# Patient Record
Sex: Male | Born: 1995 | Race: Black or African American | Hispanic: No | Marital: Single
Health system: Southern US, Community
[De-identification: ages and names within clinical notes are randomized; demographics above are authoritative.]

---
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
## 2017-08-06 ENCOUNTER — Telehealth: Payer: Self-pay | Admitting: Podiatry

## 2017-08-06 NOTE — Telephone Encounter (Signed)
Called and left message on home answering machine letting pt's mother know that since the pt is over 20 years old he would have to fill out and sign a medical records release form to obtain his records. I stated we can e-mail him one, fax it, send it via postal mail, or he could come by the office to fill it out. I asked her to call me back at [phone] to let me know how he would like to obtain the form.

## 2017-08-06 NOTE — Telephone Encounter (Signed)
I'm calling for [facility] who was seen around a year ago for an infected ingrown toenail. 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 So can someone please give me a call back here at [phone] so I can address how can he go about getting the record that he needs to have to use during the appeal at [facility] [employer] [facility].

## 2017-08-07 ENCOUNTER — Encounter: Payer: Self-pay | Admitting: Podiatry

## 2017-08-07 ENCOUNTER — Telehealth: Payer: Self-pay | Admitting: Podiatry

## 2017-08-07 NOTE — Progress Notes (Signed)
A medical records release form was e-mailed to pt at the given e-mail address of [email or web address] at 1:31 pm. Instructions were given in the e-mail to physically sign the form and how he can return it to me so I can release his records to him.

## 2017-08-07 NOTE — Telephone Encounter (Signed)
This is Kassim's mother calling back. Can you please e-mail the medical records release form to him at [email or web address]? Thank you.

## 2017-09-25 ENCOUNTER — Ambulatory Visit: Payer: Medicaid Other | Admitting: Family Medicine

## 2017-11-18 ENCOUNTER — Encounter: Payer: Self-pay | Admitting: Family Medicine

## 2017-11-18 ENCOUNTER — Other Ambulatory Visit: Payer: Self-pay

## 2017-11-18 ENCOUNTER — Ambulatory Visit: Payer: Medicaid Other | Attending: Family Medicine | Admitting: Family Medicine

## 2017-11-18 VITALS — BP 115/79 | HR 69 | Temp 99.0°F | Resp 16 | Ht 71.0 in | Wt 253.4 lb

## 2017-11-18 DIAGNOSIS — Z8249 Family history of ischemic heart disease and other diseases of the circulatory system: Secondary | ICD-10-CM | POA: Diagnosis not present

## 2017-11-18 DIAGNOSIS — Z7689 Persons encountering health services in other specified circumstances: Secondary | ICD-10-CM | POA: Diagnosis present

## 2017-11-18 DIAGNOSIS — L309 Dermatitis, unspecified: Secondary | ICD-10-CM | POA: Diagnosis not present

## 2017-11-18 MED ORDER — HYDROCORTISONE 0.5 % EX CREA: 1.0000 "application " | TOPICAL_CREAM | Freq: Two times a day (BID) | CUTANEOUS | 6 refills | Status: AC

## 2017-11-18 MED ORDER — KETOCONAZOLE 2 % EX SHAM: 1.0000 "application " | MEDICATED_SHAMPOO | CUTANEOUS | 6 refills | Status: AC

## 2017-11-18 MED ORDER — KETOCONAZOLE 2 % EX CREA: 1.0000 "application " | TOPICAL_CREAM | Freq: Every day | CUTANEOUS | 6 refills | Status: AC

## 2017-11-18 NOTE — Progress Notes (Signed)
Patient stated he hoped to overall establish care today, he also wanted to see if he could get something prescribed for his eczema.

## 2017-11-18 NOTE — Patient Instructions (Signed)

## 2017-11-18 NOTE — Progress Notes (Signed)
Subjective:    Patient ID: Shane Harrell, male    DOB: 01/17/1996, 22 y.o.   MRN: [id number]  HPI  22 yo male who presents to establish care.  Patient reports no significant past medical history.  Patient has had issues with eczema.  Patient reports that he has dry skin patches on his cheeks bilaterally.  Patient also reports that he will have flakiness of the scalp that occurs 1 to 2 days after washing his hair.  Patient states that his scalp occasionally itches.  Patient has not tried any prescription medication.  Patient has used over-the-counter bio oil on his face and has used otc hair grease on his scalp.  Patient is a rising [employer].  Patient is currently on no prescription medications.  Patient is single and does not smoke.  Patient reports family history significant for mother with hypertension.  Patient has had no past surgeries but did fall and slipped on the ice at age 22 or [age] fracturing both elbows.  Patient denies any current muscle or joint pain. History reviewed. No pertinent past medical history.  History reviewed. No pertinent surgical history.  History reviewed. No pertinent family history.  No Known Allergies  Review of Systems  Constitutional: Negative for chills, fatigue, fever and unexpected weight change.  HENT: Negative for hearing loss, sore throat and trouble swallowing.   Respiratory: Negative for cough and shortness of breath.   Cardiovascular: Negative for chest pain, palpitations and leg swelling.  Gastrointestinal: Negative for abdominal pain and nausea.  Genitourinary: Negative for dysuria and frequency.  Neurological: Negative for dizziness and headaches.  Hematological: Does not bruise/bleed easily.       Objective:   Physical Exam  Constitutional: He appears well-developed and well-nourished.  Overweight young adult male in no acute distress.  Patient is wearing glasses.  HENT:  Head:  Normocephalic.  Mouth/Throat: Oropharynx is clear and moist.  Tympanic membranes are within normal bilaterally  Eyes: Pupils are equal, round, and reactive to light. Conjunctivae and EOM are normal.  Neck: Normal range of motion. Neck supple. No thyromegaly present.  Cardiovascular: Normal rate and regular rhythm.  Pulmonary/Chest: Effort normal and breath sounds normal.  Abdominal: Soft. There is no tenderness.  Musculoskeletal:  No CVA tenderness  Lymphadenopathy:    He has no cervical adenopathy.  Skin: Rash (Patient with dry silvery appearing skin patches on the bilateral cheeks) noted.  Nursing note and vitals reviewed. BP 115/79 (BP Location: Left Arm, Patient Position: Sitting, Cuff Size: Normal)   Pulse 69   Temp 99 F (37.2 C) (Oral)   Resp 16   Ht 5\' 11"  (1.803 m)   Wt 253 lb 6.4 oz (114.9 kg)   SpO2 99%   BMI 35.34 kg/m      Assessment & Plan:  1. Eczema, unspecified type Handout provided on eczema/atopic dermatitis.  Patient will initially use ketoconazole shampoo which he may use twice per week or as often as every other day to help with scalp itchiness and flaking.  Patient will initially use ketoconazole cream to the affected facial areas once daily.  If ketoconazole cream is not effective, prescription also provided for hydrocortisone cream but patient was also made aware that steroid type cream should be used sparingly on delicate skin such as the face area and that steroid creams such as hydrocortisone can cause skin discoloration/hyperpigmentation.  Patient will follow-up as needed and in 6 months - ketoconazole (NIZORAL) 2 %  shampoo; Apply 1 application topically 2 (two) times a week.  Dispense: 120 mL; Refill: 6 - ketoconazole (NIZORAL) 2 % cream; Apply 1 application topically daily.  Dispense: 15 g; Refill: 6 - hydrocortisone cream 0.5 %; Apply 1 application topically 2 (two) times daily.  Dispense: 30 g; Refill: 6 An After Visit Summary was printed and given to  the patient.  Return in about 6 months (around 05/21/2018) for 6 mos and as needed f/u eczema.
